# Patient Record
Sex: Male | Born: 1995 | Race: White | Hispanic: No | Marital: Single | Smoking: Never smoker
Health system: Southern US, Community
[De-identification: ages and names within clinical notes are randomized; demographics above are authoritative.]

## PROBLEM LIST (undated history)

## (undated) DIAGNOSIS — F909 Attention-deficit hyperactivity disorder, unspecified type: Secondary | ICD-10-CM

## (undated) HISTORY — DX: Attention-deficit hyperactivity disorder, unspecified type: F90.9

## (undated) HISTORY — PX: HYPOSPADIAS CORRECTION: SHX483

---
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
## 2016-01-10 MED FILL — VYVANSE 30 MG CAPSULE: 30 | 30 days supply | Qty: 30 | Fill #0

## 2016-01-10 MED FILL — VYVANSE 20 MG CAPSULE: 20 | 30 days supply | Qty: 30 | Fill #0

## 2016-01-30 IMAGING — CR DG TOE GREAT 2+V*R*
2 series · 2 of 2 positions shown · non-contrast
Comparison: None.

CLINICAL DATA: Injury to right great toe a week ago

EXAM:
RIGHT GREAT TOE

[AP]
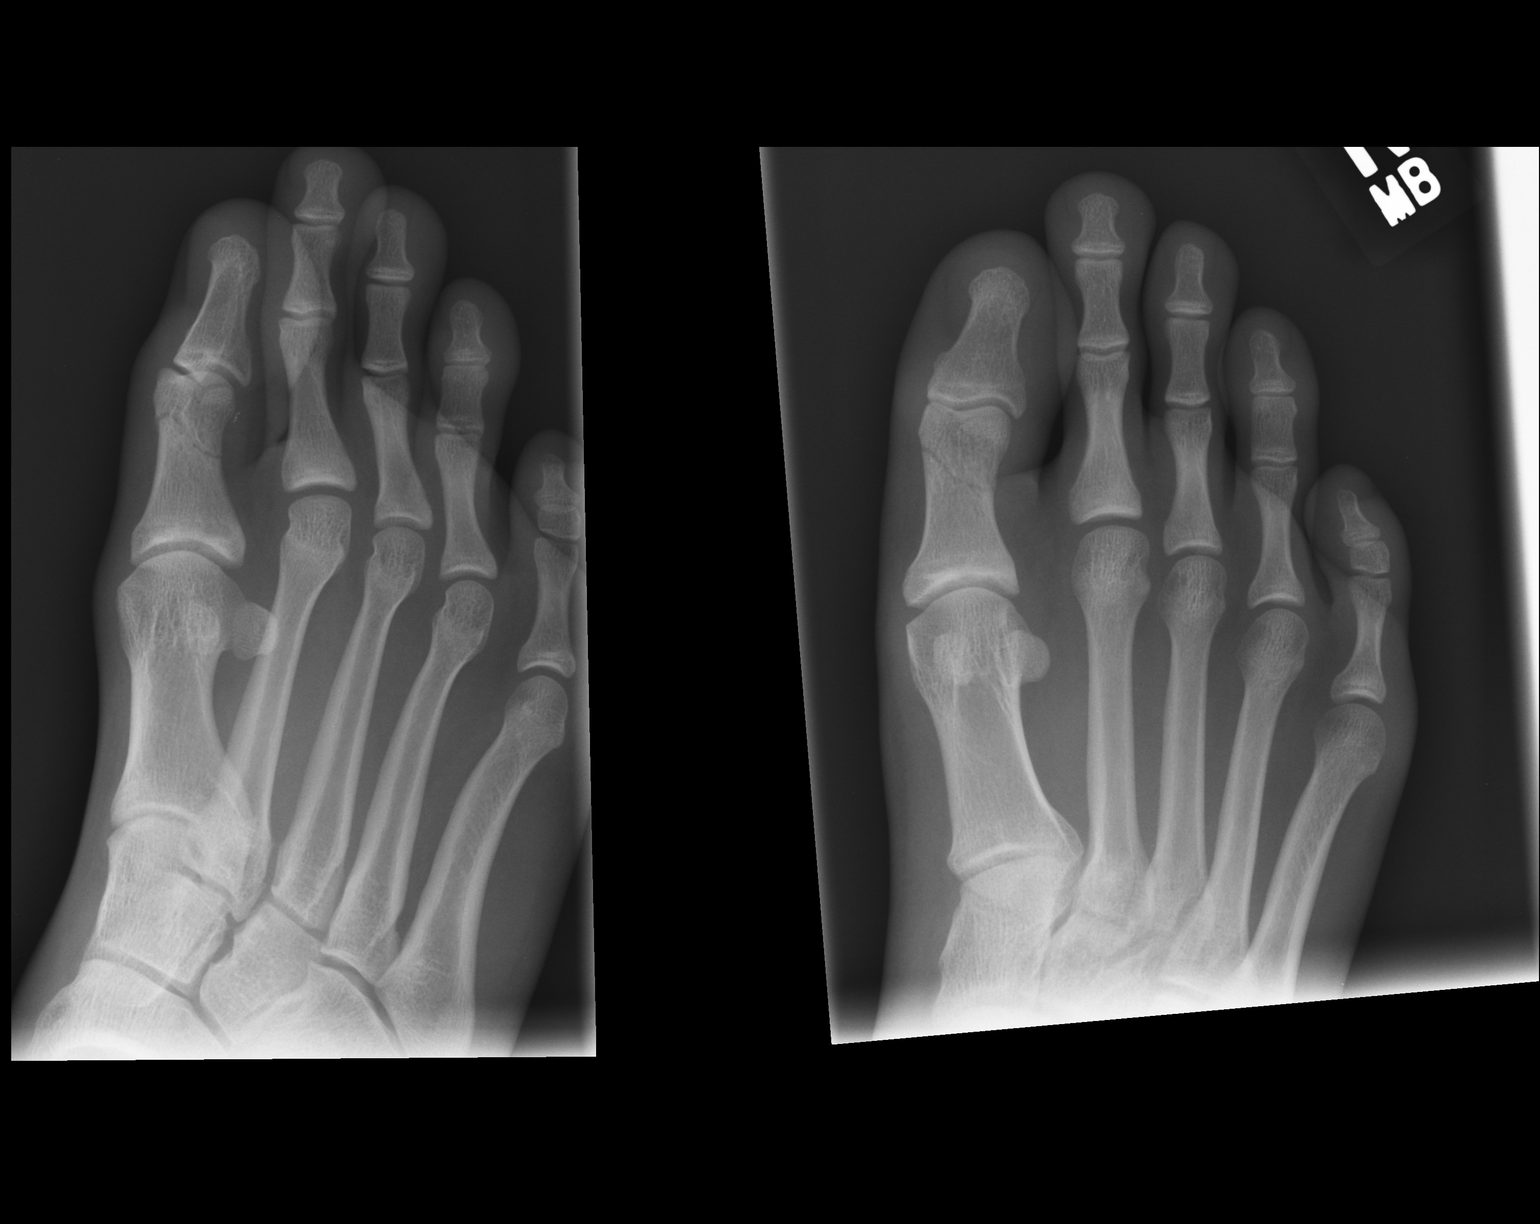

[oblique]
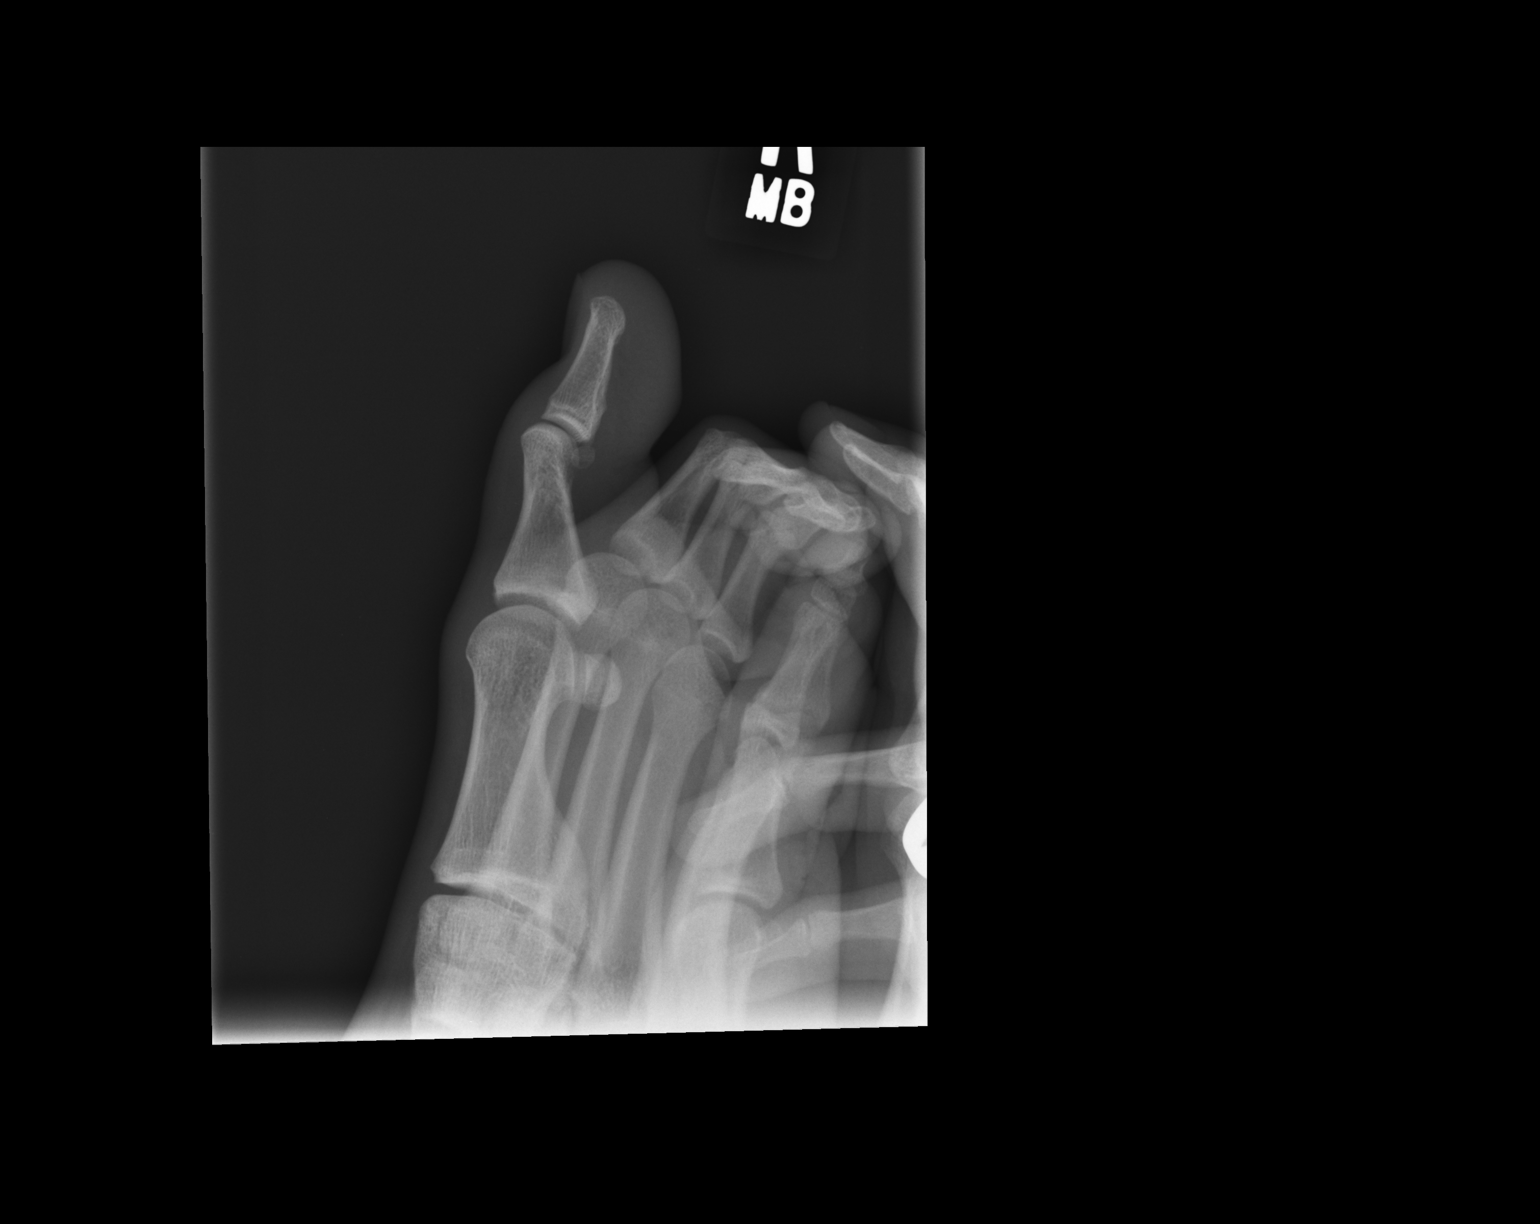

[2 of 2 positions shown; findings below may reference images not displayed]

FINDINGS: There is a nondisplaced fracture involving the distal shaft of the
first proximal phalanx. No dislocations identified. No radiopaque
foreign bodies or soft tissue calcification.
IMPRESSION: 1. Nondisplaced fracture involves the distal shaft of the first
metatarsal bone.

## 2016-02-07 MED FILL — VYVANSE 20 MG CAPSULE: 20 | 30 days supply | Qty: 30 | Fill #0

## 2016-02-07 MED FILL — VYVANSE 30 MG CAPSULE: 30 | 30 days supply | Qty: 30 | Fill #0

## 2016-03-12 MED FILL — VYVANSE 20 MG CAPSULE: 20 | 30 days supply | Qty: 30 | Fill #0

## 2016-03-12 MED FILL — VYVANSE 30 MG CAPSULE: 30 | 30 days supply | Qty: 30 | Fill #0

## 2016-04-11 MED FILL — VYVANSE 30 MG CAPSULE: 30 | 30 days supply | Qty: 30 | Fill #0

## 2016-04-14 MED FILL — VYVANSE 20 MG CAPSULE: 20 | 30 days supply | Qty: 30 | Fill #0

## 2016-07-08 MED FILL — VYVANSE 30 MG CAPSULE: 30 | 30 days supply | Qty: 30 | Fill #0

## 2016-07-10 MED FILL — VYVANSE 20 MG CAPSULE: 20 | 30 days supply | Qty: 30 | Fill #0

## 2016-08-06 MED FILL — VYVANSE 30 MG CAPSULE: 30 | 30 days supply | Qty: 30 | Fill #0

## 2016-08-06 MED FILL — VYVANSE 20 MG CAPSULE: 20 | 30 days supply | Qty: 30 | Fill #0

## 2016-10-01 MED FILL — VYVANSE 20 MG CAPSULE: 20 | 30 days supply | Qty: 30 | Fill #0

## 2016-10-01 MED FILL — VYVANSE 30 MG CAPSULE: 30 | 30 days supply | Qty: 30 | Fill #0

## 2016-12-03 ENCOUNTER — Ambulatory Visit: Payer: BLUE CROSS/BLUE SHIELD | Admitting: Family Medicine

## 2016-12-09 MED FILL — VYVANSE 30 MG CAPSULE: 30 | 30 days supply | Qty: 30 | Fill #0

## 2016-12-10 MED FILL — VYVANSE 20 MG CAPSULE: 20 | 30 days supply | Qty: 30 | Fill #0

## 2017-01-09 MED FILL — VYVANSE 20 MG CAPSULE: 20 | 30 days supply | Qty: 30 | Fill #0

## 2017-01-09 MED FILL — VYVANSE 30 MG CAPSULE: 30 | 30 days supply | Qty: 30 | Fill #0

## 2017-02-12 MED FILL — VYVANSE 20 MG CAPSULE: 20 | 30 days supply | Qty: 30 | Fill #0

## 2017-03-09 MED FILL — VYVANSE 30 MG CAPSULE: 30 | 30 days supply | Qty: 30 | Fill #0

## 2017-03-12 MED FILL — VYVANSE 20 MG CAPSULE: 20 | 30 days supply | Qty: 30 | Fill #0

## 2017-04-20 MED FILL — VYVANSE 20 MG CAPSULE: 20 | 30 days supply | Qty: 30 | Fill #0

## 2017-04-22 MED FILL — VYVANSE 30 MG CAPSULE: 30 | 30 days supply | Qty: 30 | Fill #0

## 2017-05-21 MED FILL — VYVANSE 20 MG CAPSULE: 20 | 10 days supply | Qty: 10 | Fill #0

## 2017-05-21 MED FILL — VYVANSE 30 MG CAPSULE: 30 | 10 days supply | Qty: 10 | Fill #0

## 2017-06-05 MED FILL — VYVANSE 30 MG CAPSULE: 30 | 30 days supply | Qty: 30 | Fill #0

## 2017-06-05 MED FILL — VYVANSE 20 MG CAPSULE: 20 | 30 days supply | Qty: 30 | Fill #0

## 2017-07-27 MED FILL — VYVANSE 30 MG CAPSULE: 30 | 30 days supply | Qty: 30 | Fill #0

## 2017-09-02 MED FILL — VYVANSE 30 MG CAPSULE: 30 | 30 days supply | Qty: 30 | Fill #0

## 2017-10-28 MED FILL — VYVANSE 30 MG CAPSULE: 30 | 30 days supply | Qty: 30 | Fill #0

## 2017-11-26 MED FILL — VYVANSE 30 MG CAPSULE: 30 | 30 days supply | Qty: 30 | Fill #0

## 2018-01-15 MED FILL — VYVANSE 30 MG CAPSULE: 30 | 30 days supply | Qty: 30 | Fill #0

## 2018-02-17 MED FILL — VYVANSE 30 MG CAPSULE: 30 | 30 days supply | Qty: 30 | Fill #0

## 2018-03-17 MED FILL — VYVANSE 30 MG CAPSULE: 30 | 30 days supply | Qty: 30 | Fill #0

## 2018-04-27 MED FILL — VYVANSE 30 MG CAPSULE: 30 | 30 days supply | Qty: 30 | Fill #0

## 2018-06-16 MED FILL — VYVANSE 30 MG CAPSULE: 30 | 30 days supply | Qty: 30 | Fill #0

## 2018-07-16 MED FILL — VYVANSE 30 MG CAPSULE: 30 | 30 days supply | Qty: 30 | Fill #0

## 2018-09-17 MED FILL — VYVANSE 30 MG CAPSULE: 30 | 30 days supply | Qty: 30 | Fill #0

## 2018-09-28 ENCOUNTER — Encounter: Payer: Self-pay | Admitting: Emergency Medicine

## 2018-10-08 ENCOUNTER — Ambulatory Visit: Payer: Self-pay | Admitting: Psychiatry

## 2018-10-25 MED FILL — VYVANSE 30 MG CAPSULE: 30 | 30 days supply | Qty: 30 | Fill #0

## 2018-11-12 ENCOUNTER — Ambulatory Visit: Payer: Self-pay | Admitting: Psychiatry

## 2018-11-23 ENCOUNTER — Ambulatory Visit (INDEPENDENT_AMBULATORY_CARE_PROVIDER_SITE_OTHER): Payer: BLUE CROSS/BLUE SHIELD | Admitting: Psychiatry

## 2018-11-23 ENCOUNTER — Encounter: Payer: Self-pay | Admitting: Psychiatry

## 2018-11-23 VITALS — BP 127/86 | HR 83 | Wt 172.0 lb

## 2018-11-23 DIAGNOSIS — F900 Attention-deficit hyperactivity disorder, predominantly inattentive type: Secondary | ICD-10-CM

## 2018-11-23 NOTE — Progress Notes (Signed)
Alexander Fox [id number] 13-Apr-1996 22 y.o.  Subjective:   Patient ID:  Alexander Fox is a 22 y.o. (DOB [birth year]) male.  Chief Complaint:  Chief Complaint  Patient presents with  . ADD    HPI Alexander Fox presents to the office today for follow-up of ADD. Reports that he just finished his semester with all A's and B's and made the Dean's list. Denies any concentration issues. Reports adequate sleep and stable appetite. Denies anxiety. Mood has been "good." Denies low energy or motivation. Denies SI.   Has one semester remaining.   Past Psychiatric Medication Trials: Vyvanse Adderall XR  Review of Systems:  Review of Systems  Cardiovascular: Negative for palpitations.  Musculoskeletal: Negative for gait problem.  Neurological: Negative for tremors.  Psychiatric/Behavioral:       Please refer to HPI    Medications: I have reviewed the patient's current medications.  Current Outpatient Medications  Medication Sig Dispense Refill  . lisdexamfetamine (VYVANSE) 30 MG capsule Take 30 mg by mouth every morning.    . Multiple Vitamin (MULTIVITAMIN) tablet Take 1 tablet by mouth daily.     No current facility-administered medications for this visit.     Medication Side Effects: None  Allergies:  Allergies  Allergen Reactions  . Bexsero [Meningococcal Grp B (Recomb) Vaccine] Other (See Comments)    fever    Past Medical History:  Diagnosis Date  . ADHD (attention deficit hyperactivity disorder) 2015   sees Dr Rachel Moulds    Family History  Problem Relation Age of Onset  . Hypertension Father   . Hypertension Mother   . Stroke Maternal Grandmother   . Cancer Maternal Grandmother        breast  . Diabetes Maternal Grandfather   . Cancer Maternal Grandfather        prostate  . CAD Other        grandparents  . ADD / ADHD Brother   . ADD / ADHD Cousin     Social History   Socioeconomic History  . Marital status: Single    Spouse name: Not on file  .  Number of children: Not on file  . Years of education: Not on file  . Highest education level: Not on file  Occupational History  . Not on file  Social Needs  . Financial resource strain: Not on file  . Food insecurity:    Worry: Not on file    Inability: Not on file  . Transportation needs:    Medical: Not on file    Non-medical: Not on file  Tobacco Use  . Smoking status: Never Smoker  . Smokeless tobacco: Former Network engineer and Sexual Activity  . Alcohol use: Yes    Comment: On weekends  . Drug use: Not Currently  . Sexual activity: Not on file  Lifestyle  . Physical activity:    Days per week: Not on file    Minutes per session: Not on file  . Stress: Not on file  Relationships  . Social connections:    Talks on phone: Not on file    Gets together: Not on file    Attends religious service: Not on file    Active member of club or organization: Not on file    Attends meetings of clubs or organizations: Not on file    Relationship status: Not on file  . Intimate partner violence:    Fear of current or ex partner: Not on file  Emotionally abused: Not on file    Physically abused: Not on file    Forced sexual activity: Not on file  Other Topics Concern  . Not on file  Social History Narrative   Lives with mom and dad, 2 cats and 1 dog   [employer] Senior, good grades, wants to go to [employer] on farm    Activity: baseball   Diet: some fruits/vegetables     Past Medical History, Surgical history, Social history, and Family history were reviewed and updated as appropriate.   Please see review of systems for further details on the patient's review from today.   Objective:   Physical Exam:  BP 127/86   Pulse 83   Wt 172 lb (78 kg)   Physical Exam Constitutional:      General: He is not in acute distress.    Appearance: He is well-developed.  Musculoskeletal:        General: No deformity.  Neurological:     Mental Status: He is alert and oriented  to person, place, and time.     Coordination: Coordination normal.  Psychiatric:        Mood and Affect: Mood is not anxious or depressed. Affect is not labile, blunt, angry or inappropriate.        Speech: Speech normal.        Behavior: Behavior normal.        Thought Content: Thought content normal. Thought content does not include homicidal or suicidal ideation. Thought content does not include homicidal or suicidal plan.        Judgment: Judgment normal.     Comments: Insight intact. No auditory or visual hallucinations. No delusions.      Lab Review:     Component Value Date/Time   NA 138 09/11/2010 2033   K 4.1 09/11/2010 2033   CL 103 09/11/2010 2033   CO2 27 09/11/2010 2033   GLUCOSE 99 09/11/2010 2033   BUN 12 09/11/2010 2033   CREATININE 0.57 09/11/2010 2033   CALCIUM 9.6 09/11/2010 2033   PROT 7.4 09/11/2010 2033   ALBUMIN 4.6 09/11/2010 2033   AST 36 09/11/2010 2033   ALT 11 09/11/2010 2033   ALKPHOS 379 09/11/2010 2033   BILITOT 0.7 09/11/2010 2033   GFRNONAA NOT CALCULATED 09/11/2010 2033   GFRAA  09/11/2010 2033    NOT CALCULATED        The eGFR has been calculated using the MDRD equation. This calculation has not been validated in all clinical situations. eGFR's persistently <60 mL/min signify possible Chronic Kidney Disease.       Component Value Date/Time   WBC 4.9 03/06/2015 0840   RBC 4.74 03/06/2015 0840   HGB 14.4 03/06/2015 0840   HCT 42.1 03/06/2015 0840   PLT 235.0 03/06/2015 0840   MCV 88.9 03/06/2015 0840   MCV 92.0 02/22/2015 1925   MCH 29.4 02/22/2015 1925   MCH 30.6 09/11/2010 2033   MCHC 34.1 03/06/2015 0840   RDW 12.7 03/06/2015 0840   LYMPHSABS 2.5 03/06/2015 0840   MONOABS 0.5 03/06/2015 0840   EOSABS 0.2 03/06/2015 0840   BASOSABS 0.0 03/06/2015 0840    No results found for: POCLITH, LITHIUM   No results found for: PHENYTOIN, PHENOBARB, VALPROATE, CBMZ   .res Assessment: Plan:   Reports that he has 2 Vyvanse  scripts remaining, one due to be filled now.  Patient reports he or his mother will call office when an additional script is  needed. Continue Vyvanse 30 mg daily for attention deficit. Attention deficit hyperactivity disorder (ADHD), predominantly inattentive type  Please see After Visit Summary for patient specific instructions.  No future appointments.  No orders of the defined types were placed in this encounter.     -------------------------------

## 2018-11-29 MED FILL — VYVANSE 30 MG CAPSULE: 30 | 30 days supply | Qty: 30 | Fill #0

## 2019-01-03 MED FILL — VYVANSE 30 MG CAPSULE: 30 | 30 days supply | Qty: 30 | Fill #0

## 2019-02-08 ENCOUNTER — Other Ambulatory Visit: Payer: Self-pay

## 2019-02-08 MED ORDER — LISDEXAMFETAMINE DIMESYLATE 30 MG PO CAPS: 30.0000 mg | ORAL_CAPSULE | ORAL | 0 refills | Status: DC

## 2019-02-09 MED FILL — VYVANSE 30 MG CAPSULE: 30 | 30 days supply | Qty: 30 | Fill #0

## 2019-03-04 ENCOUNTER — Telehealth: Payer: Self-pay | Admitting: Psychiatry

## 2019-03-04 ENCOUNTER — Ambulatory Visit: Payer: BLUE CROSS/BLUE SHIELD | Admitting: Psychiatry

## 2019-03-04 NOTE — Telephone Encounter (Signed)
Last fill 02/09/2019

## 2019-03-04 NOTE — Telephone Encounter (Signed)
We had to cancel Prem' appt today.  His mother is to call to RS, but he does need refill of his Vyvanse.  Please send to [facility] OP.

## 2019-03-07 ENCOUNTER — Other Ambulatory Visit: Payer: Self-pay

## 2019-03-07 MED ORDER — LISDEXAMFETAMINE DIMESYLATE 30 MG PO CAPS: 30.0000 mg | ORAL_CAPSULE | ORAL | 0 refills | Status: DC

## 2019-03-07 NOTE — Telephone Encounter (Signed)
Submitted to provider for approval 

## 2019-03-08 MED FILL — VYVANSE 30 MG CAPSULE: 30 | 30 days supply | Qty: 30 | Fill #0

## 2019-04-21 ENCOUNTER — Ambulatory Visit: Payer: BLUE CROSS/BLUE SHIELD | Admitting: Psychiatry

## 2020-02-01 ENCOUNTER — Telehealth: Payer: Self-pay | Admitting: *Deleted

## 2020-02-01 NOTE — Telephone Encounter (Signed)
Home healthcare worker. Called to schedule 1st Covid 10 vaccine. Scheduled for Sunday, 02/05/20 at the [facility].

## 2020-02-05 ENCOUNTER — Ambulatory Visit: Payer: Self-pay | Attending: Internal Medicine

## 2020-02-05 DIAGNOSIS — Z23 Encounter for immunization: Secondary | ICD-10-CM | POA: Insufficient documentation

## 2020-02-05 NOTE — Progress Notes (Signed)
   Covid-19 Vaccination Clinic  Name:  RIKKI HAMDAN    MRN: [id number] DOB: [birth year]  02/05/2020  Mr. Persons was observed post Covid-19 immunization for 15 minutes without incidence. He was provided with Vaccine Information Sheet and instruction to access the V-Safe system.   Mr. Blackard was instructed to call 911 with any severe reactions post vaccine: Marland Kitchen Difficulty breathing  . Swelling of your face and throat  . A fast heartbeat  . A bad rash all over your body  . Dizziness and weakness    Immunizations Administered    Name Date Dose VIS Date Route   Pfizer COVID-19 Vaccine 02/05/2020 11:57 AM 0.3 mL 11/18/2019 Intramuscular   Manufacturer: [place]   Lot: [id number]   [place]: [id number]

## 2020-03-05 ENCOUNTER — Ambulatory Visit: Payer: Self-pay | Attending: Internal Medicine

## 2020-03-05 ENCOUNTER — Ambulatory Visit: Payer: Self-pay

## 2020-03-05 DIAGNOSIS — Z23 Encounter for immunization: Secondary | ICD-10-CM

## 2020-03-05 NOTE — Progress Notes (Signed)
   Covid-19 Vaccination Clinic  Name:  Alexander Fox    MRN: [id number] DOB: 1996/08/22  03/05/2020  [name] was observed post Covid-19 immunization for 15 minutes without incident. He was provided with Vaccine Information Sheet and instruction to access the V-Safe system.   [name] was instructed to call 911 with any severe reactions post vaccine: Marland Kitchen Difficulty breathing  . Swelling of face and throat  . A fast heartbeat  . A bad rash all over body  . Dizziness and weakness   Immunizations Administered    Name Date Dose VIS Date Route   Pfizer COVID-19 Vaccine 03/05/2020  8:15 AM 0.3 mL 11/18/2019 Intramuscular   Manufacturer: [place]   Lot: [id number]   [place]: [id number]

## 2020-08-20 ENCOUNTER — Ambulatory Visit (INDEPENDENT_AMBULATORY_CARE_PROVIDER_SITE_OTHER): Payer: BLUE CROSS/BLUE SHIELD | Admitting: Dermatology

## 2020-08-20 ENCOUNTER — Encounter: Payer: Self-pay | Admitting: Dermatology

## 2020-08-20 ENCOUNTER — Other Ambulatory Visit: Payer: Self-pay

## 2020-08-20 DIAGNOSIS — Z808 Family history of malignant neoplasm of other organs or systems: Secondary | ICD-10-CM | POA: Diagnosis not present

## 2020-08-20 DIAGNOSIS — D485 Neoplasm of uncertain behavior of skin: Secondary | ICD-10-CM

## 2020-08-20 NOTE — Progress Notes (Signed)
   New Patient Visit  Subjective  Alexander Fox is a 24 y.o. male who presents for the following: Nevus (Patient would like mole at belly button evaluated. No change, present since childhood.).  Family history of skin cancer.   The following portions of the chart were reviewed this encounter and updated as appropriate:  Tobacco  Allergies  Meds  Problems  Med Hx  Surg Hx  Fam Hx     Review of Systems:  No other skin or systemic complaints except as noted in HPI or Assessment and Plan.  Objective  Well appearing patient in no apparent distress; mood and affect are within normal limits.  A focused examination was performed including abdomen. Relevant physical exam findings are noted in the Assessment and Plan.  Objective  Umbilicus: 8.6PY brown papule      Assessment & Plan  Neoplasm of uncertain behavior of skin Umbilicus  Skin / nail biopsy Type of biopsy: tangential   Informed consent: discussed and consent obtained   Timeout: patient name, date of birth, surgical site, and procedure verified   Procedure prep:  Patient was prepped and draped in usual sterile fashion Prep type:  Isopropyl alcohol Anesthesia: the lesion was anesthetized in a standard fashion   Anesthetic:  1% lidocaine w/ epinephrine 1-100,000 buffered w/ 8.4% NaHCO3 Instrument used: flexible razor blade   Hemostasis achieved with: pressure, aluminum chloride and electrodesiccation   Outcome: patient tolerated procedure well   Post-procedure details: sterile dressing applied and wound care instructions given   Dressing type: petrolatum and bandage    Specimen 1 - Surgical pathology Differential Diagnosis: Irritated Nevus r/o Dysplasia Check Margins: No 0.8cm brown papule  Return if symptoms worsen or fail to improve.  Graciella Belton, RMA, am acting as scribe for Sarina Ser, MD . Documentation: I have reviewed the above documentation for accuracy and completeness, and I agree with the  above.  Sarina Ser, MD

## 2020-08-20 NOTE — Patient Instructions (Addendum)
Wound Care Instructions  1. Cleanse wound gently with soap and water once a day then pat dry with clean gauze. Apply a thing coat of Petrolatum (petroleum jelly, "Vaseline") over the wound (unless you have an allergy to this). We recommend that you use a new, sterile tube of Vaseline. Do not pick or remove scabs. Do not remove the yellow or white "healing tissue" from the base of the wound.  2. Cover the wound with fresh, clean, nonstick gauze and secure with paper tape. You may use Band-Aids in place of gauze and tape if the would is small enough, but would recommend trimming much of the tape off as there is often too much. Sometimes Band-Aids can irritate the skin.  3. You should call the office for your biopsy report after 1 week if you have not already been contacted.  4. If you experience any problems, such as abnormal amounts of bleeding, swelling, significant bruising, significant pain, or evidence of infection, please call the office immediately.  5. FOR ADULT SURGERY PATIENTS: If you need something for pain relief you may take 1 extra strength Tylenol (acetaminophen) AND 2 Ibuprofen (200mg each) together every 4 hours as needed for pain. (do not take these if you are allergic to them or if you have a reason you should not take them.) Typically, you may only need pain medication for 1 to 3 days.   Melanoma ABCDEs  Melanoma is the most dangerous type of skin cancer, and is the leading cause of death from skin disease.  You are more likely to develop melanoma if you:  Have light-colored skin, light-colored eyes, or red or blond hair  Spend a lot of time in the sun  Tan regularly, either outdoors or in a tanning bed  Have had blistering sunburns, especially during childhood  Have a close family member who has had a melanoma  Have atypical moles or large birthmarks  Early detection of melanoma is key since treatment is typically straightforward and cure rates are extremely high if we  catch it early.   The first sign of melanoma is often a change in a mole or a new dark spot.  The ABCDE system is a way of remembering the signs of melanoma.  A for asymmetry:  The two halves do not match. B for border:  The edges of the growth are irregular. C for color:  A mixture of colors are present instead of an even brown color. D for diameter:  Melanomas are usually (but not always) greater than 6mm - the size of a pencil eraser. E for evolution:  The spot keeps changing in size, shape, and color.  Please check your skin once per month between visits. You can use a small mirror in front and a large mirror behind you to keep an eye on the back side or your body.   If you see any new or changing lesions before your next follow-up, please call to schedule a visit.  Please continue daily skin protection including broad spectrum sunscreen SPF 30+ to sun-exposed areas, reapplying every 2 hours as needed when you're outdoors.     

## 2020-08-23 ENCOUNTER — Telehealth: Payer: Self-pay

## 2020-08-23 NOTE — Telephone Encounter (Signed)
-----   Message from Ralene Bathe, MD sent at 08/23/2020  1:16 PM EDT ----- Skin , umbilicus MELANOCYTIC NEVUS, INTRADERMAL TYPE  Benign mole

## 2020-08-23 NOTE — Telephone Encounter (Signed)
Advised patient's mother of results/hd  

## 2022-11-24 ENCOUNTER — Telehealth: Payer: Self-pay | Admitting: Family Medicine

## 2022-11-24 NOTE — Telephone Encounter (Signed)
Patient called and stated he hasn't been here in years and wanted to know if Dr. Darnell Level will accept him as a new patient. Call  back number [phone].

## 2022-11-24 NOTE — Telephone Encounter (Signed)
Ok to schedule. thanks

## 2022-11-25 NOTE — Telephone Encounter (Signed)
Spoke to pt, scheduled ov for 12/05/22

## 2022-12-05 ENCOUNTER — Ambulatory Visit (INDEPENDENT_AMBULATORY_CARE_PROVIDER_SITE_OTHER): Payer: Managed Care, Other (non HMO) | Admitting: Family Medicine

## 2022-12-05 ENCOUNTER — Encounter: Payer: Self-pay | Admitting: Family Medicine

## 2022-12-05 VITALS — BP 134/78 | HR 98 | Temp 97.9°F | Ht 69.5 in | Wt 174.0 lb

## 2022-12-05 DIAGNOSIS — Z13220 Encounter for screening for lipoid disorders: Secondary | ICD-10-CM

## 2022-12-05 DIAGNOSIS — Z131 Encounter for screening for diabetes mellitus: Secondary | ICD-10-CM

## 2022-12-05 DIAGNOSIS — Z0000 Encounter for general adult medical examination without abnormal findings: Secondary | ICD-10-CM | POA: Diagnosis not present

## 2022-12-05 DIAGNOSIS — Z113 Encounter for screening for infections with a predominantly sexual mode of transmission: Secondary | ICD-10-CM

## 2022-12-05 DIAGNOSIS — F900 Attention-deficit hyperactivity disorder, predominantly inattentive type: Secondary | ICD-10-CM

## 2022-12-05 DIAGNOSIS — Z23 Encounter for immunization: Secondary | ICD-10-CM

## 2022-12-05 DIAGNOSIS — Z9622 Myringotomy tube(s) status: Secondary | ICD-10-CM

## 2022-12-05 MED ORDER — ASCORBIC ACID 500 MG PO TABS: 500.0000 mg | ORAL_TABLET | Freq: Every day | ORAL | Status: DC

## 2022-12-05 NOTE — Progress Notes (Signed)
Patient ID: Alexander Fox, male    DOB: Apr 25, 1996, 26 y.o.   MRN: [id number]  This visit was conducted in person.  BP 134/78   Pulse 98   Temp 97.9 F (36.6 C) (Temporal)   Ht 5' 9.5" (1.765 m)   Wt 174 lb (78.9 kg)   SpO2 98%   BMI 25.33 kg/m    CC: re-establish care, CPE Subjective:   HPI: Alexander Fox is a 26 y.o. male presenting on 12/05/2022 for Re-establish Care (Wants CPE)   ADHD - previously on vyvanse '30mg'$  daily, off for several years, stopped due to anorexia side effect. He tried adderall as well.  He started taking alpha-brain supplement for cognitive support - has lion's mane in it.   Graduated [employer]. Now working for [employer] in [employer].   Has fiance. Planning marriage next 6 months.   Preventative: Flu shot - yearly [place] 01/2020, 02/2020 Tdap updated today Pneumonia shot - not due Shingrix - not due Seat belt use discussed Sunscreen use discussed. No changing moles on skin. Sleep - averaging 7-8 hours/night Smoking - none, occ vaping  Alcohol  - occ  Rec drugs - occ MJ, delta-8 Sexually active currently - with fiance.  Dentist - Q6 mo  Eye exam - yearly   Lives with fiance and 3 dogs  Occ: [employer] in [employer]  Edu: [employer]  Activity: walking dogs, planning to start gym Diet: rec increased water intake, caffeine, fruits/vegetables     Relevant past medical, surgical, family and social history reviewed and updated as indicated. Interim medical history since our last visit reviewed. Allergies and medications reviewed and updated. Outpatient Medications Prior to Visit  Medication Sig Dispense Refill   BIOTIN PO Take by mouth daily.     Multiple Vitamin (MULTIVITAMIN) tablet Take 1 tablet by mouth daily.     Omega-3 Fatty Acids (FISH OIL PO) Take by mouth daily.     lisdexamfetamine (VYVANSE) 30 MG capsule Take 1 capsule (30 mg total)  by mouth every morning. 30 capsule 0   No facility-administered medications prior to visit.     Per HPI unless specifically indicated in ROS section below Review of Systems  Constitutional:  Negative for activity change, appetite change, chills, fatigue, fever and unexpected weight change.  HENT:  Negative for hearing loss.   Eyes:  Negative for visual disturbance.  Respiratory:  Negative for cough, chest tightness, shortness of breath and wheezing.   Cardiovascular:  Negative for chest pain, palpitations and leg swelling.  Gastrointestinal:  Negative for abdominal distention, abdominal pain, blood in stool, constipation, diarrhea, nausea and vomiting.  Genitourinary:  Negative for difficulty urinating and hematuria.  Musculoskeletal:  Negative for arthralgias, myalgias and neck pain.  Skin:  Negative for rash.  Neurological:  Negative for dizziness, seizures, syncope and headaches.  Hematological:  Negative for adenopathy. Does not bruise/bleed easily.  Psychiatric/Behavioral:  Negative for dysphoric mood. The patient is not nervous/anxious.     Objective:  BP 134/78   Pulse 98   Temp 97.9 F (36.6 C) (Temporal)   Ht 5' 9.5" (1.765 m)   Wt 174 lb (78.9 kg)   SpO2 98%   BMI 25.33 kg/m   Wt Readings from Last 3 Encounters:  12/05/22 174 lb (78.9 kg)  03/06/15 142 lb 12.8 oz (64.8 kg) (43 %, Z= -0.17)*  02/22/15 137 lb 2 oz (62.2 kg) (  34 %, Z= -0.42)*   * Growth percentiles are based on CDC (Boys, 2-20 Years) data.      Physical Exam Vitals and nursing note reviewed.  Constitutional:      General: He is not in acute distress.    Appearance: Normal appearance. He is well-developed. He is not ill-appearing.  HENT:     Head: Normocephalic and atraumatic.     Right Ear: Hearing, ear canal and external ear normal. A PE tube is present.     Left Ear: Hearing, tympanic membrane, ear canal and external ear normal.     Ears:     Comments: Seems to have retained tympanostomy tube  behind R TM    Mouth/Throat:     Comments: Wearing mask Eyes:     General: No scleral icterus.    Extraocular Movements: Extraocular movements intact.     Conjunctiva/sclera: Conjunctivae normal.     Pupils: Pupils are equal, round, and reactive to light.  Neck:     Thyroid: No thyroid mass or thyromegaly.  Cardiovascular:     Rate and Rhythm: Normal rate and regular rhythm.     Pulses: Normal pulses.          Radial pulses are 2+ on the right side and 2+ on the left side.     Heart sounds: Normal heart sounds. No murmur heard. Pulmonary:     Effort: Pulmonary effort is normal. No respiratory distress.     Breath sounds: Normal breath sounds. No wheezing, rhonchi or rales.  Abdominal:     General: Bowel sounds are normal. There is no distension.     Palpations: Abdomen is soft. There is no mass.     Tenderness: There is no abdominal tenderness. There is no guarding or rebound.     Hernia: No hernia is present.  Musculoskeletal:        General: Normal range of motion.     Cervical back: Normal range of motion and neck supple.     Right lower leg: No edema.     Left lower leg: No edema.  Lymphadenopathy:     Cervical: No cervical adenopathy.  Skin:    General: Skin is warm and dry.     Findings: No rash.  Neurological:     General: No focal deficit present.     Mental Status: He is alert and oriented to person, place, and time.  Psychiatric:        Mood and Affect: Mood normal.        Behavior: Behavior normal.        Thought Content: Thought content normal.        Judgment: Judgment normal.       Results for orders placed or performed in visit on 03/06/15  CBC with Differential/Platelet  Result Value Ref Range   WBC 4.9 4.5 - 13.5 K/uL   RBC 4.74 3.80 - 5.70 Mil/uL   Hemoglobin 14.4 12.0 - 16.0 g/dL   HCT 42.1 36.0 - 49.0 %   MCV 88.9 78.0 - 98.0 fl   MCHC 34.1 31.0 - 37.0 g/dL   RDW 12.7 11.4 - 15.5 %   Platelets 235.0 150.0 - 575.0 K/uL   Neutrophils Relative %  36.0 (L) 43.0 - 71.0 %   Lymphocytes Relative 50.3 (H) 24.0 - 48.0 %   Monocytes Relative 10.0 3.0 - 12.0 %   Eosinophils Relative 3.3 0.0 - 5.0 %   Basophils Relative 0.4 0.0 - 3.0 %  Neutro Abs 1.8 1.4 - 7.7 K/uL   Lymphs Abs 2.5 0.7 - 4.0 K/uL   Monocytes Absolute 0.5 0.1 - 1.0 K/uL   Eosinophils Absolute 0.2 0.0 - 0.7 K/uL   Basophils Absolute 0.0 0.0 - 0.1 K/uL    Assessment & Plan:   Problem List Items Addressed This Visit     Health maintenance examination - Primary (Chronic)    Preventative protocols reviewed and updated unless pt declined. Discussed healthy diet and lifestyle.       ADHD (attention deficit hyperactivity disorder)    Stable period off stimulant, only on Onnit alpha-brain supplement. Discussed this.       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       Relevant Orders   Ambulatory referral to ENT   Other Visit Diagnoses     Need for influenza vaccination       Relevant Orders   Flu Vaccine QUAD 90moIM (Fluarix, Fluzone & Alfiuria Quad PF) (Completed)   Screen for STD (sexually transmitted disease)       Relevant Orders   HIV Antibody (routine testing w rflx)   RPR   C. trachomatis/N. gonorrhoeae RNA   Lipid screening       Relevant Orders   Lipid panel   Diabetes mellitus screening       Relevant Orders   Basic metabolic panel   Need for Tdap vaccination       Relevant Orders   Tdap vaccine greater than or equal to 7yo IM (Completed)        Meds ordered this encounter  Medications   ascorbic acid (VITAMIN C) 500 MG tablet    Sig: Take 1 tablet (500 mg total) by mouth daily.   Orders Placed This Encounter  Procedures   C. trachomatis/N. gonorrhoeae RNA   Flu Vaccine QUAD 694moM (Fluarix, Fluzone & Alfiuria Quad PF)   Tdap vaccine greater than or equal to 7yo IM   Lipid panel    Standing Status:   Future    Standing Expiration Date:   1284/13/2440 Basic metabolic panel     Standing Status:   Future    Standing Expiration Date:   12/06/2023   HIV Antibody (routine testing w rflx)    Standing Status:   Future    Standing Expiration Date:   12/06/2023   RPR    Standing Status:   Future    Standing Expiration Date:   12/06/2023   Ambulatory referral to ENT    Referral Priority:   Routine    Referral Type:   Consultation    Referral Reason:   Specialty Services Required    Requested Specialty:   Otolaryngology    Number of Visits Requested:   1    Patient instructions: Flu shot today Tdap (tetanus and whooping cough) today Urine today. Return fasting for labwork at your convenience. Nice to see you today Return as needed or in 2-3 years for next physical.  Follow up plan: Return in about 2 years (around 12/05/2024) for annual exam, prior fasting for blood work.  JaRia BushMD

## 2022-12-05 NOTE — Assessment & Plan Note (Addendum)
Refer back to ENT for concern for retained tube.

## 2022-12-05 NOTE — Patient Instructions (Addendum)
Flu shot today Tdap (tetanus and whooping cough) today Urine today. Return fasting for labwork at your convenience. Nice to see you today Return as needed or in 2-3 years for next physical.  Health Maintenance, Male Adopting a healthy lifestyle and getting preventive care are important in promoting health and wellness. Ask your health care provider about: The right schedule for you to have regular tests and exams. Things you can do on your own to prevent diseases and keep yourself healthy. What should I know about diet, weight, and exercise? Eat a healthy diet  Eat a diet that includes plenty of vegetables, fruits, low-fat dairy products, and lean protein. Do not eat a lot of foods that are high in solid fats, added sugars, or sodium. Maintain a healthy weight Body mass index (BMI) is a measurement that can be used to identify possible weight problems. It estimates body fat based on height and weight. Your health care provider can help determine your BMI and help you achieve or maintain a healthy weight. Get regular exercise Get regular exercise. This is one of the most important things you can do for your health. Most adults should: Exercise for at least 150 minutes each week. The exercise should increase your heart rate and make you sweat (moderate-intensity exercise). Do strengthening exercises at least twice a week. This is in addition to the moderate-intensity exercise. Spend less time sitting. Even light physical activity can be beneficial. Watch cholesterol and blood lipids Have your blood tested for lipids and cholesterol at 25 years of age, then have this test every 5 years. You may need to have your cholesterol levels checked more often if: Your lipid or cholesterol levels are high. You are older than 25 years of age. You are at high risk for heart disease. What should I know about cancer screening? Many types of cancers can be detected early and may often be prevented.  Depending on your health history and family history, you may need to have cancer screening at various ages. This may include screening for: Colorectal cancer. Prostate cancer. Skin cancer. Lung cancer. What should I know about heart disease, diabetes, and high blood pressure? Blood pressure and heart disease High blood pressure causes heart disease and increases the risk of stroke. This is more likely to develop in people who have high blood pressure readings or are overweight. Talk with your health care provider about your target blood pressure readings. Have your blood pressure checked: Every 3-5 years if you are 31-70 years of age. Every year if you are 47 years old or older. If you are between the ages of 75 and 71 and are a current or former smoker, ask your health care provider if you should have a one-time screening for abdominal aortic aneurysm (AAA). Diabetes Have regular diabetes screenings. This checks your fasting blood sugar level. Have the screening done: Once every three years after age 82 if you are at a normal weight and have a low risk for diabetes. More often and at a younger age if you are overweight or have a high risk for diabetes. What should I know about preventing infection? Hepatitis B If you have a higher risk for hepatitis B, you should be screened for this virus. Talk with your health care provider to find out if you are at risk for hepatitis B infection. Hepatitis C Blood testing is recommended for: Everyone born from [birth year] through 1964. Anyone with known risk factors for hepatitis C. Sexually transmitted infections (STIs) You  should be screened each year for STIs, including gonorrhea and chlamydia, if: You are sexually active and are younger than 25 years of age. You are older than 25 years of age and your health care provider tells you that you are at risk for this type of infection. Your sexual activity has changed since you were last screened, and you are  at increased risk for chlamydia or gonorrhea. Ask your health care provider if you are at risk. Ask your health care provider about whether you are at high risk for HIV. Your health care provider may recommend a prescription medicine to help prevent HIV infection. If you choose to take medicine to prevent HIV, you should first get tested for HIV. You should then be tested every 3 months for as long as you are taking the medicine. Follow these instructions at home: Alcohol use Do not drink alcohol if your health care provider tells you not to drink. If you drink alcohol: Limit how much you have to 0-2 drinks a day. Know how much alcohol is in your drink. In the [place] one drink equals one 12 oz bottle of beer (355 mL), one 5 oz glass of wine (148 mL), or one 1 oz glass of hard liquor (44 mL). Lifestyle Do not use any products that contain nicotine or tobacco. These products include cigarettes, chewing tobacco, and vaping devices, such as e-cigarettes. If you need help quitting, ask your health care provider. Do not use street drugs. Do not share needles. Ask your health care provider for help if you need support or information about quitting drugs. General instructions Schedule regular health, dental, and eye exams. Stay current with your vaccines. Tell your health care provider if: You often feel depressed. You have ever been abused or do not feel safe at home. Summary Adopting a healthy lifestyle and getting preventive care are important in promoting health and wellness. Follow your health care provider's instructions about healthy diet, exercising, and getting tested or screened for diseases. Follow your health care provider's instructions on monitoring your cholesterol and blood pressure. This information is not intended to replace advice given to you by your health care provider. Make sure you discuss any questions you have with your health care provider. Document Revised: 04/15/2021  Document Reviewed: 04/15/2021 Elsevier Patient Education  [place].

## 2022-12-05 NOTE — Assessment & Plan Note (Signed)
Preventative protocols reviewed and updated unless pt declined. Discussed healthy diet and lifestyle.  

## 2022-12-05 NOTE — Assessment & Plan Note (Signed)
Stable period off stimulant, only on Onnit alpha-brain supplement. Discussed this.

## 2022-12-06 LAB — C. TRACHOMATIS/N. GONORRHOEAE RNA
C. trachomatis RNA, TMA: NOT DETECTED
N. gonorrhoeae RNA, TMA: NOT DETECTED

## 2022-12-09 ENCOUNTER — Other Ambulatory Visit (INDEPENDENT_AMBULATORY_CARE_PROVIDER_SITE_OTHER): Payer: Managed Care, Other (non HMO)

## 2022-12-09 DIAGNOSIS — Z131 Encounter for screening for diabetes mellitus: Secondary | ICD-10-CM

## 2022-12-09 DIAGNOSIS — Z13220 Encounter for screening for lipoid disorders: Secondary | ICD-10-CM | POA: Diagnosis not present

## 2022-12-09 DIAGNOSIS — Z113 Encounter for screening for infections with a predominantly sexual mode of transmission: Secondary | ICD-10-CM

## 2022-12-09 LAB — LIPID PANEL
Cholesterol: 159 mg/dL (ref 0–200)
HDL: 70.3 mg/dL (ref >39.00)
LDL Cholesterol: 76 mg/dL (ref 0–99)
NonHDL: 88.36
Total CHOL/HDL Ratio: 2
Triglycerides: 62 mg/dL (ref 0.0–149.0)
VLDL: 12.4 mg/dL (ref 0.0–40.0)

## 2022-12-09 LAB — BASIC METABOLIC PANEL
BUN: 18 mg/dL (ref 6–23)
CO2: 28 mEq/L (ref 19–32)
Calcium: 9.6 mg/dL (ref 8.4–10.5)
Chloride: 104 mEq/L (ref 96–112)
Creatinine, Ser: 0.98 mg/dL (ref 0.40–1.50)
GFR: 107.2 mL/min (ref >60.00)
Glucose, Bld: 98 mg/dL (ref 70–99)
Potassium: 4.4 mEq/L (ref 3.5–5.1)
Sodium: 141 mEq/L (ref 135–145)

## 2022-12-10 ENCOUNTER — Encounter: Payer: Self-pay | Admitting: *Deleted

## 2022-12-10 LAB — RPR: RPR Ser Ql: NONREACTIVE

## 2022-12-10 LAB — HIV ANTIBODY (ROUTINE TESTING W REFLEX): HIV 1&2 Ab, 4th Generation: NONREACTIVE

## 2023-10-08 ENCOUNTER — Encounter (HOSPITAL_BASED_OUTPATIENT_CLINIC_OR_DEPARTMENT_OTHER): Payer: Self-pay

## 2023-10-08 ENCOUNTER — Emergency Department (HOSPITAL_BASED_OUTPATIENT_CLINIC_OR_DEPARTMENT_OTHER)
Admission: EM | Admit: 2023-10-08 | Discharge: 2023-10-08 | Disposition: A | Discharge status: Home / Self Care | Payer: BC Managed Care – PPO | Attending: Emergency Medicine | Admitting: Emergency Medicine

## 2023-10-08 ENCOUNTER — Emergency Department (HOSPITAL_BASED_OUTPATIENT_CLINIC_OR_DEPARTMENT_OTHER): Payer: BC Managed Care – PPO

## 2023-10-08 ENCOUNTER — Other Ambulatory Visit: Payer: Self-pay

## 2023-10-08 DIAGNOSIS — S060X0A Concussion without loss of consciousness, initial encounter: Secondary | ICD-10-CM | POA: Diagnosis not present

## 2023-10-08 DIAGNOSIS — S199XXA Unspecified injury of neck, initial encounter: Secondary | ICD-10-CM | POA: Diagnosis not present

## 2023-10-08 DIAGNOSIS — S0990XA Unspecified injury of head, initial encounter: Secondary | ICD-10-CM | POA: Diagnosis present

## 2023-10-08 DIAGNOSIS — S161XXA Strain of muscle, fascia and tendon at neck level, initial encounter: Secondary | ICD-10-CM | POA: Insufficient documentation

## 2023-10-08 MED ORDER — ACETAMINOPHEN 500 MG PO TABS
1000.0000 mg | ORAL_TABLET | Freq: Once | ORAL | Status: AC
  Administered 2023-10-08: 1000 mg via ORAL
  Filled 2023-10-08: qty 2
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
## 2023-10-13 ENCOUNTER — Telehealth: Payer: Self-pay

## 2023-10-13 NOTE — Transitions of Care (Post Inpatient/ED Visit) (Signed)
Unable to reach patient by phone and left v/m requesting call back at [phone].     10/13/2023  Name: Alexander Fox MRN: [id number] DOB: May 09, 1996  Today's TOC FU Call Status: Today's TOC FU Call Status:: Unsuccessful Call (1st Attempt) Unsuccessful Call (1st Attempt) Date: 10/13/23  Attempted to reach the patient regarding the most recent Inpatient/ED visit.  Follow Up Plan: Additional outreach attempts will be made to reach the patient to complete the Transitions of Care (Post Inpatient/ED visit) call.   Signature  Lewanda Rife, LPN

## 2023-10-14 NOTE — Transitions of Care (Post Inpatient/ED Visit) (Signed)
Unable to reach patient by phone and left v/m requesting call back at [phone].       10/14/2023  Name: Alexander Fox MRN: [id number] DOB: [birth year]  Today's TOC FU Call Status: Today's TOC FU Call Status:: Unsuccessful Call (2nd Attempt) Unsuccessful Call (1st Attempt) Date: 10/13/23 Unsuccessful Call (2nd Attempt) Date: 10/14/23  Attempted to reach the patient regarding the most recent Inpatient/ED visit.  Follow Up Plan: Additional outreach attempts will be made to reach the patient to complete the Transitions of Care (Post Inpatient/ED visit) call.   Signature Lewanda Rife, LPN
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
## 2023-10-15 ENCOUNTER — Ambulatory Visit: Payer: BC Managed Care – PPO | Admitting: Family Medicine

## 2023-10-16 ENCOUNTER — Ambulatory Visit: Payer: BC Managed Care – PPO | Admitting: Family Medicine

## 2023-10-16 ENCOUNTER — Encounter: Payer: Self-pay | Admitting: Family Medicine

## 2023-10-16 VITALS — BP 110/72 | HR 93 | Temp 99.8°F | Ht 69.5 in | Wt 171.4 lb

## 2023-10-16 DIAGNOSIS — M545 Low back pain, unspecified: Secondary | ICD-10-CM | POA: Diagnosis not present

## 2023-10-16 DIAGNOSIS — M542 Cervicalgia: Secondary | ICD-10-CM | POA: Diagnosis not present

## 2023-10-16 DIAGNOSIS — S060X0D Concussion without loss of consciousness, subsequent encounter: Secondary | ICD-10-CM

## 2023-10-16 MED ORDER — CYCLOBENZAPRINE HCL 10 MG PO TABS: 10.0000 mg | ORAL_TABLET | Freq: Every evening | ORAL | 0 refills | Status: AC | PRN

## 2023-10-16 MED ORDER — DICLOFENAC SODIUM 75 MG PO TBEC: 75.0000 mg | DELAYED_RELEASE_TABLET | Freq: Two times a day (BID) | ORAL | 0 refills | Status: AC
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
## 2023-10-16 NOTE — Patient Instructions (Addendum)
Use heat on low back and neck.  Start home physical therapy for low back and neck pain. Use diclofenac 75 mg twice daily on a full stomach as needed for pain and inflammation.  Can also use muscle relaxant Flexeril half to 1 full tablet at bedtime as needed. Call if symptoms or not improving as expected.
